# Patient Record
Sex: Male | Born: 1975 | Race: Black or African American | Hispanic: No | Marital: Single | Smoking: Never smoker
Health system: Southern US, Community
[De-identification: ages and names within clinical notes are randomized; demographics above are authoritative.]

## PROBLEM LIST (undated history)

## (undated) HISTORY — PX: HERNIA REPAIR: SHX51

---

## 2014-07-28 ENCOUNTER — Emergency Department
Admission: EM | Admit: 2014-07-28 | Discharge: 2014-07-28 | Disposition: A | Discharge status: Home / Self Care | Payer: Self-pay | Attending: Emergency Medicine | Admitting: Emergency Medicine

## 2014-07-28 ENCOUNTER — Encounter: Payer: Self-pay | Admitting: Emergency Medicine

## 2014-07-28 DIAGNOSIS — R197 Diarrhea, unspecified: Secondary | ICD-10-CM | POA: Insufficient documentation

## 2014-07-28 DIAGNOSIS — R112 Nausea with vomiting, unspecified: Secondary | ICD-10-CM

## 2014-07-28 DIAGNOSIS — K759 Inflammatory liver disease, unspecified: Secondary | ICD-10-CM | POA: Insufficient documentation

## 2014-07-28 LAB — COMPREHENSIVE METABOLIC PANEL
ALBUMIN: 4 g/dL (ref 3.5–5.0)
ALT: 123 U/L — ABNORMAL HIGH (ref 17–63)
AST: 168 U/L — ABNORMAL HIGH (ref 15–41)
Alkaline Phosphatase: 121 U/L (ref 38–126)
BUN: 12 mg/dL (ref 6–20)
CO2: 25 mmol/L (ref 22–32)
Calcium: 9.1 mg/dL (ref 8.9–10.3)
Chloride: 101 mmol/L (ref 101–111)
Creatinine, Ser: 0.76 mg/dL (ref 0.61–1.24)
GFR calc Af Amer: >60 mL/min (ref >60)
GLUCOSE: 106 mg/dL — AB (ref 65–99)
POTASSIUM: 3.9 mmol/L (ref 3.5–5.1)
Total Bilirubin: 0.7 mg/dL (ref 0.3–1.2)

## 2014-07-28 LAB — URINALYSIS COMPLETE WITH MICROSCOPIC (ARMC ONLY)
Bacteria, UA: NONE SEEN
Bilirubin Urine: NEGATIVE
GLUCOSE, UA: NEGATIVE mg/dL
HGB URINE DIPSTICK: NEGATIVE
Ketones, ur: NEGATIVE mg/dL
Leukocytes, UA: NEGATIVE
NITRITE: NEGATIVE
PH: 5 (ref 5.0–8.0)
Protein, ur: NEGATIVE mg/dL
Specific Gravity, Urine: 1.021 (ref 1.005–1.030)
Squamous Epithelial / LPF: NONE SEEN

## 2014-07-28 LAB — CBC
HCT: 44.4 % (ref 40.0–52.0)
Hemoglobin: 15.3 g/dL (ref 13.0–18.0)
MCH: 31.1 pg (ref 26.0–34.0)
MCHC: 34.6 g/dL (ref 32.0–36.0)
MCV: 89.8 fL (ref 80.0–100.0)
PLATELETS: 233 10*3/uL (ref 150–440)
RBC: 4.94 MIL/uL (ref 4.40–5.90)
RDW: 12.8 % (ref 11.5–14.5)
WBC: 6.6 10*3/uL (ref 3.8–10.6)

## 2014-07-28 LAB — ETHANOL: Alcohol, Ethyl (B): 172 mg/dL — ABNORMAL HIGH (ref <5)

## 2014-07-28 LAB — LIPASE, BLOOD: Lipase: 24 U/L (ref 22–51)

## 2014-07-28 MED ORDER — SODIUM CHLORIDE 0.9 % IV BOLUS (SEPSIS)
1000.0000 mL | Freq: Once | INTRAVENOUS | Status: AC
  Administered 2014-07-28: 1000 mL via INTRAVENOUS

## 2014-07-28 MED ORDER — METOCLOPRAMIDE HCL 5 MG/ML IJ SOLN
10.0000 mg | Freq: Once | INTRAMUSCULAR | Status: AC
  Administered 2014-07-28: 10 mg via INTRAVENOUS
  Filled 2014-07-28: qty 2

## 2014-07-28 MED ORDER — ONDANSETRON HCL 4 MG PO TABS: 4.0000 mg | ORAL_TABLET | Freq: Every day | ORAL | Status: DC | PRN

## 2014-07-28 MED ORDER — ONDANSETRON HCL 4 MG PO TABS: 4.0000 mg | ORAL_TABLET | Freq: Two times a day (BID) | ORAL | Status: DC | PRN

## 2014-07-28 MED ORDER — ONDANSETRON 4 MG PO TBDP
4.0000 mg | ORAL_TABLET | Freq: Once | ORAL | Status: AC | PRN
  Administered 2014-07-28: 4 mg via ORAL
  Filled 2014-07-28: qty 1

## 2014-07-28 NOTE — ED Provider Notes (Signed)
[facility] Emergency Department Provider Note  ____________________________________________  Time seen: 1:50 AM  I have reviewed the triage vital signs and the nursing notes.   HISTORY  Chief Complaint No chief complaint on file.  nausea vomiting for one week    HPI Austin Stone is a 39 y.o. male who reports she has been having nausea and has been vomiting for one week. He reports having loose stools. He reports the lower part of his abdomen has been a little uncomfortable, although it is not hurting him currently.  The patient does drink 7-8 beers a day  No past medical history on file.    There are no active problems to display for this patient.   Past Surgical History  Procedure Laterality Date  . Hernia repair      Current Outpatient Rx  Name  Route  Sig  Dispense  Refill  . ondansetron (ZOFRAN) 4 MG tablet   Oral   Take 1 tablet (4 mg total) by mouth 2 (two) times daily as needed for nausea or vomiting.   20 tablet   0   . ondansetron (ZOFRAN) 4 MG tablet   Oral   Take 1 tablet (4 mg total) by mouth daily as needed for nausea or vomiting.   12 tablet   0     Allergies Review of patient's allergies indicates no known allergies.  Family History  Problem Relation Age of Onset  . Asthma Mother   . Hypertension Father   . Diabetes Father     Social History History  Substance Use Topics  . Smoking status: Not on file  . Smokeless tobacco: Not on file  . Alcohol Use: 6.0 oz/week    10 Cans of beer per week    Review of Systems  Constitutional: Negative for fever. ENT: Negative for sore throat. Cardiovascular: Negative for chest pain. Respiratory: Negative for shortness of breath. Gastrointestinal: Positive for abdominal pain, vomiting and diarrhea. Genitourinary: Negative for dysuria. Musculoskeletal: No myalgias or injuries. Skin: Negative for rash. Neurological: Negative for headaches   10-point ROS otherwise  negative.  ____________________________________________   PHYSICAL EXAM:  VITAL SIGNS: ED Triage Vitals  Enc Vitals Group     BP 07/28/14 0045 155/94 mmHg     Pulse Rate 07/28/14 0045 73     Resp 07/28/14 0045 18     Temp 07/28/14 0045 97.5 F (36.4 C)     Temp Source 07/28/14 0045 Oral     SpO2 07/28/14 0045 100 %     Weight 07/28/14 0045 235 lb (106.595 kg)     Height 07/28/14 0045  (1.727 m)     Head Cir --      Peak Flow --      Pain Score --      Pain Loc --      Pain Edu? --      Excl. in GC? --     Constitutional:  Alert and oriented. Appears a little bit uncomfortable but no acute distress. ENT   Head: Normocephalic and atraumatic.   Nose: No congestion/rhinnorhea.   Mouth/Throat: Mucous membranes are moist. Cardiovascular: Normal rate, regular rhythm, no murmur noted Respiratory:  Normal respiratory effort, no tachypnea.    Breath sounds are clear and equal bilaterally.  Gastrointestinal: Soft and nontender. Normal bowel sounds.  Back: No muscle spasm, no tenderness, no CVA tenderness. Musculoskeletal: No deformity noted. Nontender with normal range of motion in all extremities.  No noted edema. Neurologic:  Normal speech and language. No gross focal neurologic deficits are appreciated.  Skin:  Skin is warm, dry. No rash noted. Psychiatric: Mood and affect are normal. Speech and behavior are normal.  ____________________________________________    LABS (pertinent positives/negatives)  Labs Reviewed  COMPREHENSIVE METABOLIC PANEL - Abnormal; Notable for the following:    Glucose, Bld 106 (*)    AST 168 (*)    ALT 123 (*)    All other components within normal limits  URINALYSIS COMPLETEWITH MICROSCOPIC ([facility] ONLY) - Abnormal; Notable for the following:    Color, Urine YELLOW (*)    APPearance CLEAR (*)    All other components within normal limits  ETHANOL - Abnormal; Notable for the following:    Alcohol, Ethyl (B) 172 (*)    All other  components within normal limits  LIPASE, BLOOD  CBC    ____________________________________________   INITIAL IMPRESSION / ASSESSMENT AND PLAN / ED COURSE  Pertinent labs & imaging results that were available during my care of the patient were reviewed by me and considered in my medical decision making (see chart for details).  Alert patient with ongoing nausea. He did receive Zofran which did not help very much. We'll give him a dose of Reglan and a liter of normal saline. His blood hemolyzed and I do not have a sodium level due to lipemict interference.  His LFTs are slightly elevated with AST little higher than ALT. This is likely due to his alcohol use. We've discussed his alcohol consumption and I have advised him to reduce this due to his current symptoms and the LFT results. He has a somewhat obese belly, but I do not appreciate any ascites.  ----------------------------------------- 4:58 AM on 07/28/2014 -----------------------------------------  I've been waiting for the urinalysis for a bit. It is now returned normal with no acute tones and no sign of infection. Reassessment of the patient finds him comfortable without nausea. We've discussed his liver enzyme levels and the need for reassessment and follow-up.    ____________________________________________   FINAL CLINICAL IMPRESSION(S) / ED DIAGNOSES  Final diagnoses:  Nausea vomiting and diarrhea  Hepatitis      Darien Ramusavid W Tedric Leeth, MD 07/28/14 [phone]

## 2014-07-28 NOTE — ED Notes (Signed)
Pt. States vomiting for the past week.  Pt. States he has been unable to hold down food for the past week.  Pt. States "I do not get sick, I do not take medications"  Pt. States he has not taken anything for current symptoms.

## 2014-07-28 NOTE — Discharge Instructions (Signed)
You have a mild elevation in your liver enzyme levels that could be from drinking alcohol. Reduce or stop altogether your alcohol consumption. [facility] or with the gastroenterologist for reassessment and further evaluation.  Take Zofran if needed for nausea. Return to the emergency department if your nausea is not controlled or you have other urgent concerns.  Nausea, Adult Nausea means you feel sick to your stomach or need to throw up (vomit). It may be a sign of a more serious problem. If nausea gets worse, you may throw up. If you throw up a lot, you may lose too much body fluid (dehydration). HOME CARE   Get plenty of rest.  Ask your doctor how to replace body fluid losses (rehydrate).  Eat small amounts of food. Sip liquids more often.  Take all medicines as told by your doctor. GET HELP RIGHT AWAY IF:  You have a fever.  You pass out (faint).  You keep throwing up or have blood in your throw up.  You are very weak, have dry lips or a dry mouth, or you are very thirsty (dehydrated).  You have dark or bloody poop (stool).  You have very bad chest or belly (abdominal) pain.  You do not get better after 2 days, or you get worse.  You have a headache. MAKE SURE YOU:  Understand these instructions.  Will watch your condition.  Will get help right away if you are not doing well or get worse. Document Released: 12/16/2010 Document Revised: 03/21/2011 Document Reviewed: 12/16/2010 [facility] Patient Information 2015 [employer] [place]. This information is not intended to replace advice given to you by your health care provider. Make sure you discuss any questions you have with your health care provider.
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
## 2014-07-31 ENCOUNTER — Other Ambulatory Visit: Payer: Self-pay | Admitting: Student

## 2014-07-31 DIAGNOSIS — R1084 Generalized abdominal pain: Secondary | ICD-10-CM

## 2014-08-08 ENCOUNTER — Ambulatory Visit: Payer: Self-pay

## 2015-06-03 ENCOUNTER — Emergency Department: Payer: Self-pay

## 2015-06-03 ENCOUNTER — Emergency Department
Admission: EM | Admit: 2015-06-03 | Discharge: 2015-06-03 | Disposition: A | Discharge status: Home / Self Care | Payer: Self-pay | Attending: Student | Admitting: Student

## 2015-06-03 ENCOUNTER — Encounter: Payer: Self-pay | Admitting: Emergency Medicine

## 2015-06-03 DIAGNOSIS — R52 Pain, unspecified: Secondary | ICD-10-CM

## 2015-06-03 DIAGNOSIS — R1010 Upper abdominal pain, unspecified: Secondary | ICD-10-CM | POA: Insufficient documentation

## 2015-06-03 DIAGNOSIS — R079 Chest pain, unspecified: Secondary | ICD-10-CM | POA: Insufficient documentation

## 2015-06-03 LAB — CBC
HCT: 42.8 % (ref 40.0–52.0)
Hemoglobin: 14.7 g/dL (ref 13.0–18.0)
MCH: 30.7 pg (ref 26.0–34.0)
MCHC: 34.3 g/dL (ref 32.0–36.0)
MCV: 89.4 fL (ref 80.0–100.0)
Platelets: 160 10*3/uL (ref 150–440)
RBC: 4.79 MIL/uL (ref 4.40–5.90)
RDW: 12.7 % (ref 11.5–14.5)
WBC: 3.7 10*3/uL — AB (ref 3.8–10.6)

## 2015-06-03 LAB — BASIC METABOLIC PANEL
Anion gap: 10 (ref 5–15)
BUN: 7 mg/dL (ref 6–20)
CHLORIDE: 97 mmol/L — AB (ref 101–111)
CO2: 27 mmol/L (ref 22–32)
CREATININE: 0.84 mg/dL (ref 0.61–1.24)
Calcium: 9.5 mg/dL (ref 8.9–10.3)
GFR calc Af Amer: >60 mL/min (ref >60)
GFR calc non Af Amer: >60 mL/min (ref >60)
GLUCOSE: 135 mg/dL — AB (ref 65–99)
Potassium: 3.6 mmol/L (ref 3.5–5.1)
SODIUM: 134 mmol/L — AB (ref 135–145)

## 2015-06-03 LAB — HEPATIC FUNCTION PANEL
ALK PHOS: 94 U/L (ref 38–126)
ALT: 105 U/L — ABNORMAL HIGH (ref 17–63)
AST: 98 U/L — ABNORMAL HIGH (ref 15–41)
Albumin: 4.3 g/dL (ref 3.5–5.0)
BILIRUBIN INDIRECT: 1.8 mg/dL — AB (ref 0.3–0.9)
BILIRUBIN TOTAL: 2 mg/dL — AB (ref 0.3–1.2)
Bilirubin, Direct: 0.2 mg/dL (ref 0.1–0.5)
Total Protein: 7.6 g/dL (ref 6.5–8.1)

## 2015-06-03 LAB — PROTIME-INR
INR: 0.99
PROTHROMBIN TIME: 13.3 s (ref 11.4–15.0)

## 2015-06-03 LAB — LIPASE, BLOOD: Lipase: 27 U/L (ref 11–51)

## 2015-06-03 LAB — TROPONIN I: Troponin I: <0.03 ng/mL (ref <0.031)

## 2015-06-03 LAB — APTT: aPTT: 30 seconds (ref 24–36)

## 2015-06-03 MED ORDER — SODIUM CHLORIDE 0.9 % IV BOLUS (SEPSIS)
500.0000 mL | Freq: Once | INTRAVENOUS | Status: AC
Start: 2015-06-03 — End: 2015-06-03
  Administered 2015-06-03: 500 mL via INTRAVENOUS

## 2015-06-03 MED ORDER — ONDANSETRON 4 MG PO TBDP
4.0000 mg | ORAL_TABLET | Freq: Once | ORAL | Status: DC
  Filled 2015-06-03: qty 1

## 2015-06-03 MED ORDER — ONDANSETRON HCL 4 MG/2ML IJ SOLN
4.0000 mg | Freq: Once | INTRAMUSCULAR | Status: AC
  Administered 2015-06-03: 4 mg via INTRAVENOUS
  Filled 2015-06-03: qty 2

## 2015-06-03 MED ORDER — DOXYCYCLINE HYCLATE 50 MG PO CAPS: 100.0000 mg | ORAL_CAPSULE | Freq: Two times a day (BID) | ORAL | Status: AC

## 2015-06-03 MED ORDER — ONDANSETRON 4 MG PO TBDP: 4.0000 mg | ORAL_TABLET | Freq: Three times a day (TID) | ORAL | Status: AC | PRN

## 2015-06-03 MED ORDER — IOPAMIDOL (ISOVUE-370) INJECTION 76%
100.0000 mL | Freq: Once | INTRAVENOUS | Status: AC | PRN
  Administered 2015-06-03: 100 mL via INTRAVENOUS

## 2015-06-03 MED ORDER — MORPHINE SULFATE (PF) 4 MG/ML IV SOLN
4.0000 mg | Freq: Once | INTRAVENOUS | Status: AC
  Administered 2015-06-03: 4 mg via INTRAVENOUS
  Filled 2015-06-03: qty 1

## 2015-06-03 NOTE — ED Notes (Signed)
MD at bedside. 

## 2015-06-03 NOTE — ED Notes (Signed)
Patient transported to Ultrasound 

## 2015-06-03 NOTE — Discharge Instructions (Signed)
As we discussed, please take the doxycycline as prescribed and follow-up with a primary care doctor as soon as possible for recheck of your blood pressure and your liver function tests which were elevated. Return immediately to the emergency department if you develop or severe worsening pain, recurrent vomiting, fevers, rash, numbness, weakness, severe headache, severe chest pain, severe abdominal pain or for any other concerns.

## 2015-06-03 NOTE — ED Notes (Signed)
Pt instructed to followup with MD about blood pressure

## 2015-06-03 NOTE — ED Notes (Signed)
Pt presents to ED with chest pain that radiates across his chest and shortness of breath. Pt states he thinks he "got bitten by a tick about a week ago and maybe pulled it off and didn't know it." vomiting since yesterday evening X5. Pt states there is a bump where the tick was and the area is itchy. Pt also reports that his "stomach has been tingling since then too". Pt currently has no increased work of breathing or acute distress noted.

## 2015-06-03 NOTE — ED Notes (Addendum)
Pt presents to ED with chest pain that radiates across his chest, dizziness, nausea, vomiting, numbness to finger tips, shortness of breath. Pt states he thinks he "got bitten by a tick about a week ago and maybe pulled it off and didn't know it."  Vomiting since yesterday evening X5 Diarrhea x1 last night. Pt states there is a bump where the tick was and the area is itchy. I didn't observe a sore on lower abd. Pt also reports that his "stomach has been tingling since then too". Lower abd pain while palpating. Pt currently has no increased work of breathing or acute distress noted. Will continue to monitor.

## 2015-06-03 NOTE — ED Notes (Signed)
Patient transported to CT 

## 2015-06-03 NOTE — ED Notes (Signed)
Pt alert and oriented X4, active, cooperative, pt in NAD. RR even and unlabored, color WNL.  Pt informed to return if any life threatening symptoms occur.   

## 2015-06-03 NOTE — ED Provider Notes (Signed)
[facility] Emergency Department Provider Note   ____________________________________________  Time seen: Approximately 6:59 AM  I have reviewed the triage vital signs and the nursing notes.   HISTORY  Chief Complaint Chest Pain    HPI Austin Stone is a 40 y.o. male with history of HTN who presents with 1 week upper abdominal pain and possible tick exposure, constant since onset mild to moderate, no modifying factors. Patient reports that possibly a week ago he had a tick bite on the lower abdomen and that he scratched. He reports that since that time he has felt nauseated. Yesterday he had 5 episodes of nonbloody nonbilious emesis and 3 episodes of nonbloody diarrhea. He has had subjective fevers and chills as well as intermittent headaches. He has had intermittent bilateral chest pains which last "a few seconds". The pain is not exertional, not radiating but is associated with some shortness of breath at times. He denies any chest pain currently. No known coronary artery disease, no family history of early coronary artery disease. No personal or family history of PE or DVT.   History reviewed. No pertinent past medical history.  There are no active problems to display for this patient.   Past Surgical History  Procedure Laterality Date  . Hernia repair      Current Outpatient Rx  Name  Route  Sig  Dispense  Refill  . doxycycline (VIBRAMYCIN) 50 MG capsule   Oral   Take 2 capsules (100 mg total) by mouth 2 (two) times daily.   40 capsule   0   . ondansetron (ZOFRAN ODT) 4 MG disintegrating tablet   Oral   Take 1 tablet (4 mg total) by mouth every 8 (eight) hours as needed for nausea or vomiting.   12 tablet   0     Allergies Review of patient's allergies indicates no known allergies.  Family History  Problem Relation Age of Onset  . Asthma Mother   . Hypertension Father   . Diabetes Father     Social History Social History    Substance Use Topics  . Smoking status: Never Smoker   . Smokeless tobacco: None  . Alcohol Use: 6.0 oz/week    10 Cans of beer per week    Review of Systems Constitutional: + subjective fever/chills Eyes: No visual changes. ENT: No sore throat. Cardiovascular: + chest pain. Respiratory: + shortness of breath. Gastrointestinal: + abdominal pain.  + nausea, + vomiting.  + diarrhea.  No constipation. Genitourinary: Negative for dysuria. Musculoskeletal: Negative for back pain. Skin: Negative for rash. Neurological: Positive for headaches, focal weakness or numbness.  10-point ROS otherwise negative.  ____________________________________________   PHYSICAL EXAM:  VITAL SIGNS: ED Triage Vitals  Enc Vitals Group     BP 06/03/15 0629 183/124 mmHg     Pulse Rate 06/03/15 0629 72     Resp 06/03/15 0629 20     Temp 06/03/15 0629 97.4 F (36.3 C)     Temp Source 06/03/15 0629 Oral     SpO2 06/03/15 0629 96 %     Weight 06/03/15 0629 238 lb (107.956 kg)     Height 06/03/15 0629 5\' 8"  (1.727 m)     Head Cir --      Peak Flow --      Pain Score 06/03/15 0628 7     Pain Loc --      Pain Edu? --      Excl. in GC? --  Constitutional: Alert and oriented. Well appearing and in no acute distress. Eyes: Conjunctivae are normal. PERRL. EOMI. Head: Atraumatic. Nose: No congestion/rhinnorhea. Mouth/Throat: Mucous membranes are moist.  Oropharynx non-erythematous. Neck: No stridor.  Supple without meningismus. Cardiovascular: Normal rate, regular rhythm. Grossly normal heart sounds.  Good peripheral circulation. Respiratory: Normal respiratory effort.  No retractions. Lungs CTAB. Gastrointestinal: Abdomen is soft, there is tenderness to palpation in the epigastrium and the right upper quadrant. No rebound, no guarding. No CVA tenderness. Genitourinary: deferred Musculoskeletal: No lower extremity tenderness nor edema.  No joint effusions. Neurologic:  Normal speech and language.  No gross focal neurologic deficits are appreciated.  Skin:  Skin is warm, dry and intact. No rash noted. Psychiatric: Mood and affect are normal. Speech and behavior are normal.  ____________________________________________   LABS (all labs ordered are listed, but only abnormal results are displayed)  Labs Reviewed  BASIC METABOLIC PANEL - Abnormal; Notable for the following:    Sodium 134 (*)    Chloride 97 (*)    Glucose, Bld 135 (*)    All other components within normal limits  CBC - Abnormal; Notable for the following:    WBC 3.7 (*)    All other components within normal limits  HEPATIC FUNCTION PANEL - Abnormal; Notable for the following:    AST 98 (*)    ALT 105 (*)    Total Bilirubin 2.0 (*)    Indirect Bilirubin 1.8 (*)    All other components within normal limits  TROPONIN I  LIPASE, BLOOD  PROTIME-INR  APTT   ____________________________________________  EKG  ED ECG REPORT I, Gayla Doss, the attending physician, personally viewed and interpreted this ECG.   Date: 06/03/2015  EKG Time: 06:31  Rate: 78  Rhythm: normal sinus rhythm  Axis: normal  Intervals:none  ST&T Change: No acute ST elevation. Q waves and T-wave inversion isolated to lead 3. RSR prime in V1 and V2.  ____________________________________________  RADIOLOGY  CXR IMPRESSION: 1. Mild to moderate cardiomegaly. Although the cardiac contour is not strongly suggestive, consider the possibility of pericardial effusion in this setting. 2. No superimposed acute cardiopulmonary abnormality.  CTA chest/CT abdomen and pelvis  IMPRESSION: 1. No evidence of acute pulmonary embolus. 2. Cardiomegaly. No pericardial effusion. No acute pulmonary process. 3. Fatty liver disease. 4. Occasional large bowel diverticula with no active inflammation.  RUQ ultrasound IMPRESSION: 1. No gallstones or sonographic evidence of acute cholecystitis. If gallbladder dysfunction is suspected clinically, a  nuclear medicine hepatobiliary scan may be useful. 2. Fatty infiltrative change of the liver.  ____________________________________________   PROCEDURES  Procedure(s) performed: None  Critical Care performed: No  ____________________________________________   INITIAL IMPRESSION / ASSESSMENT AND PLAN / ED COURSE  Pertinent labs & imaging results that were available during my care of the patient were reviewed by me and considered in my medical decision making (see chart for details).  Austin Stone is a 40 y.o. male with history of HTN who presents with 1 week upper abdominal pain and possible tick exposure. On exam, he is very well-appearing and in no acute distress. Vital signs are notable for hypertension with a blood pressure of 183/124 on arrival, improved to 164/119 without any Intervention. He reports that he previously was prescribed antihypertensive medications but his doctor took him off of them. His exam is notable for some epigastric and right upper quadrant tenderness. There is no rash on the abdomen, no erythema migrans, no evidence of local reaction to a tick bite.  His EKG is reassuring and not consistent with acute ischemia. Plan for screening labs, chest x-ray, right upper quadrant ultrasound, we'll treat his pain and reassess for disposition.  ----------------------------------------- 10:41 AM on 06/03/2015 ----------------------------------------- The patient continues to appear well, he is not requiring any medications for pain. He is tolerating by mouth intake without vomiting. I reviewed his labs. BMP is generally unremarkable. CBC and CMP are notable for minor abnormalities including very mild leukopenia with white blood cell count of 3.7. Normal lipase, negative troponin. LFTs show mild AST and ALT elevation however this appears to be chronic and is improved from his hepatic function panel obtained 10 months ago. He does have mild elevation of his T bili with  indirect hyperbilirubinemia. He has normal coags without evidence of liver failure. Clinical picture is not consistent with acute biliary obstruction. His chest x-ray was read as cardiomegaly with concern for possible pericardial effusion. Troponin was negative, heart score 2, low risk for ACS. I obtained a CTA of his chest as well as CT of the abdomen and pelvis, there is no PE, there is no pericardial effusion and there is no radiologically identified cause for his complaints today. I discussed with him that the exact cause of his symptoms is unknown. Symptoms may be related to a virus but given his recent possible tick exposure, we'll treat with doxycycline. Discussed that he needs to follow up quickly with a primary care doctor regarding his high blood pressure and for recheck, we discussed return precautions and need for close follow-up and he is comfortable with the discharge plan. DC home. ____________________________________________   FINAL CLINICAL IMPRESSION(S) / ED DIAGNOSES  Final diagnoses:  Pain  Chest pain, unspecified chest pain type  Pain of upper abdomen      NEW MEDICATIONS STARTED DURING THIS VISIT:  Discharge Medication List as of 06/03/2015 10:56 AM    START taking these medications   Details  doxycycline (VIBRAMYCIN) 50 MG capsule Take 2 capsules (100 mg total) by mouth 2 (two) times daily., Starting 06/03/2015, Until Discontinued, Print    ondansetron (ZOFRAN ODT) 4 MG disintegrating tablet Take 1 tablet (4 mg total) by mouth every 8 (eight) hours as needed for nausea or vomiting., Starting 06/03/2015, Until Discontinued, Print         Note:  This document was prepared using Dragon voice recognition software and may include unintentional dictation errors.    Gayla DossEryka A Mae Cianci, MD 06/03/15 [phone]

## 2017-05-26 IMAGING — CR DG CHEST 2V
1 series · 2 of 2 positions shown · non-contrast
Comparison: None.

CLINICAL DATA: 39-year-old male with chest pain dizziness nausea
vomiting and shortness of Breath. Tick bite 1 week ago. Initial
encounter.

EXAM:
CHEST  2 VIEW

[Series 1: dg chest 2 view · 0.14mm/px · 2 of 2 slices shown]
[im 1/2]
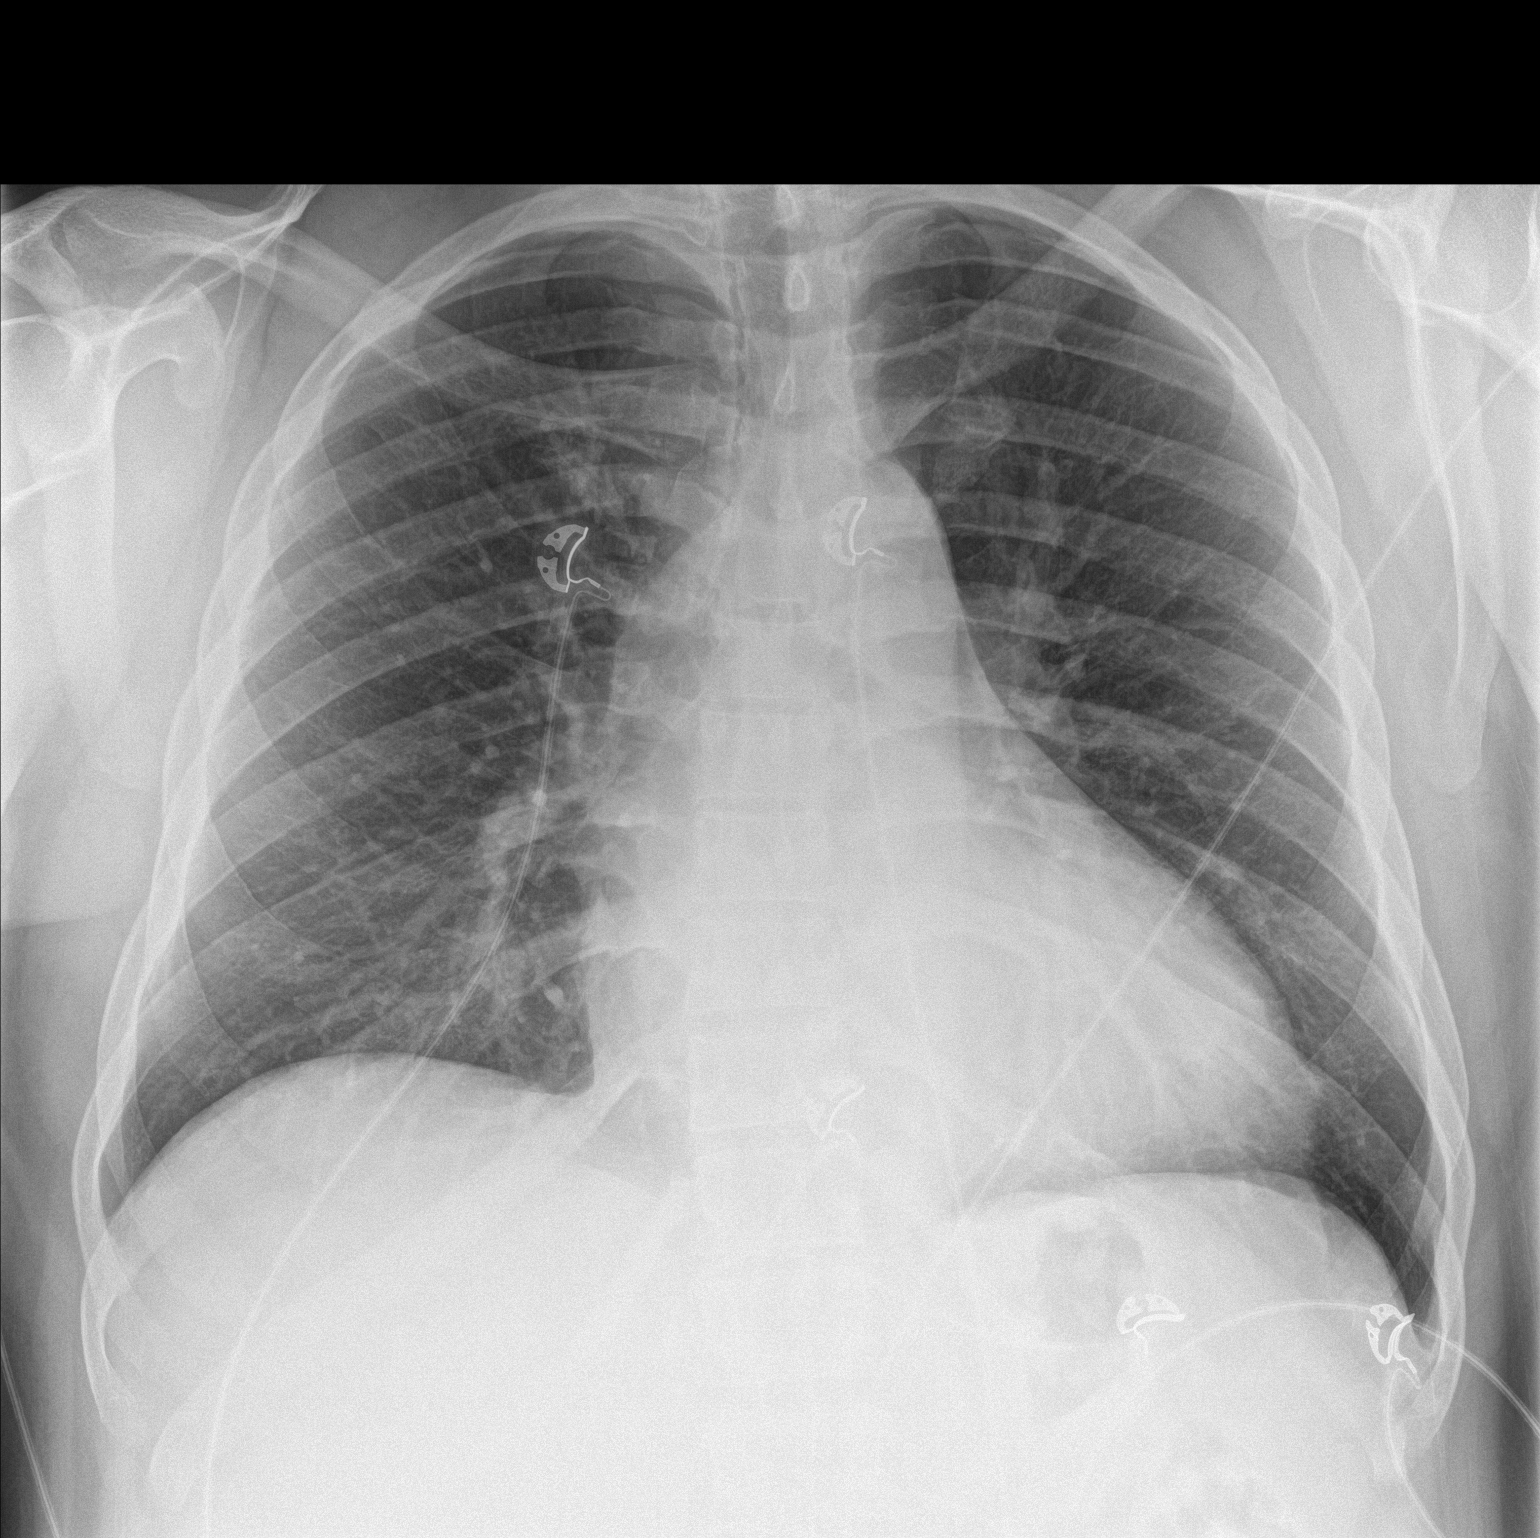
[im 2/2]
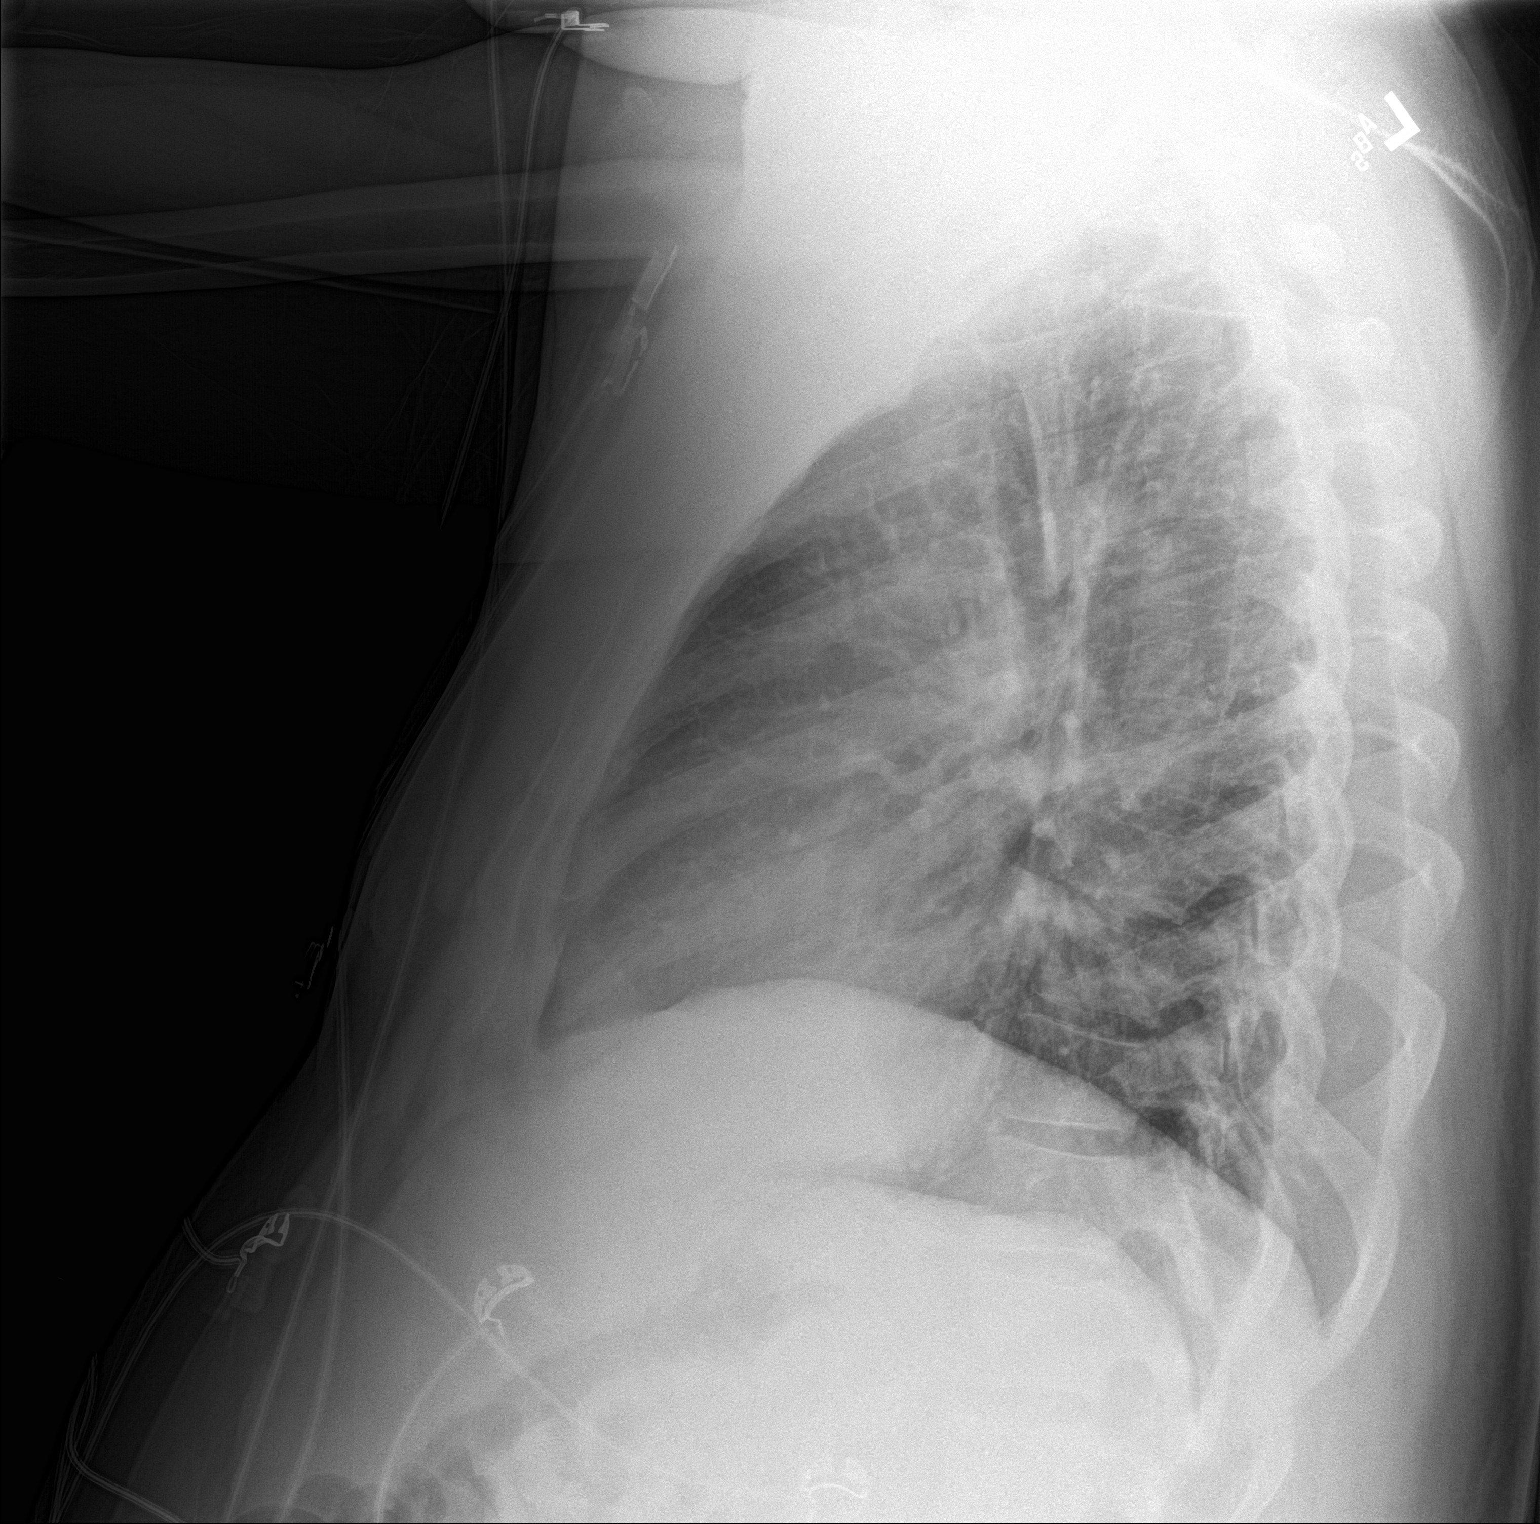

[2 of 2 positions shown; findings below may reference images not displayed]

FINDINGS: Mild to moderate cardiomegaly. Other mediastinal contours are within
normal limits. Visualized tracheal air column is within normal
limits. No pulmonary edema, pneumothorax or pleural effusion. No
confluent pulmonary opacity. Very mild thoracic scoliosis. No other
osseous abnormality identified.
IMPRESSION: 1. Mild to moderate cardiomegaly. Although the cardiac contour is
not strongly suggestive, consider the possibility of pericardial
effusion in this setting.
2. No superimposed acute cardiopulmonary abnormality.

## 2017-05-26 IMAGING — CT CT ANGIO CHEST
3 of 10 series · 18 of 46 positions shown · IV contrast (APPLIED)
Comparison: Chest radiographs 1117 hours today. Abdomen ultrasound
8383 hours.

CLINICAL DATA: 39-year-old male with intermittent chest pain and
shortness of breath for 1 week. Right abdominal pain for 2 days with
nausea and vomiting yesterday. Initial encounter.

EXAM:
CT ANGIOGRAPHY CHEST
CT ABDOMEN AND PELVIS WITH CONTRAST
TECHNIQUE: Multidetector CT imaging of the chest was performed using the
standard protocol during bolus administration of intravenous
contrast. Multiplanar CT image reconstructions and MIPs were
obtained to evaluate the vascular anatomy. Multidetector CT imaging
of the abdomen and pelvis was performed using the standard protocol
during bolus administration of intravenous contrast.
CONTRAST:  100 mL Isovue 370

[Series 5: pe thins 1.5 · axial · 0.68mm/px · z∈[+346,+602]mm · 13 of 249 slices shown]
[im 18/249  lung]
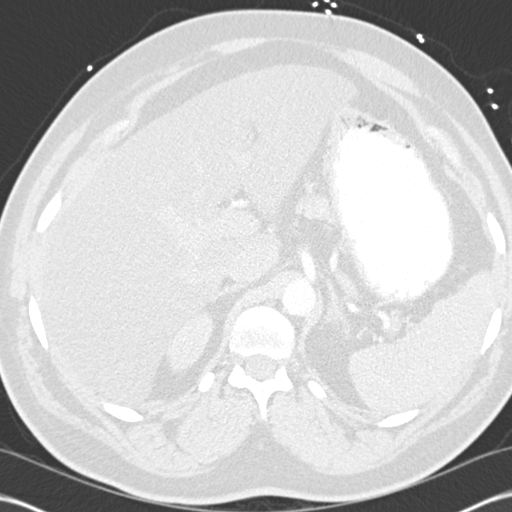
[im 36/249  soft-tissue]
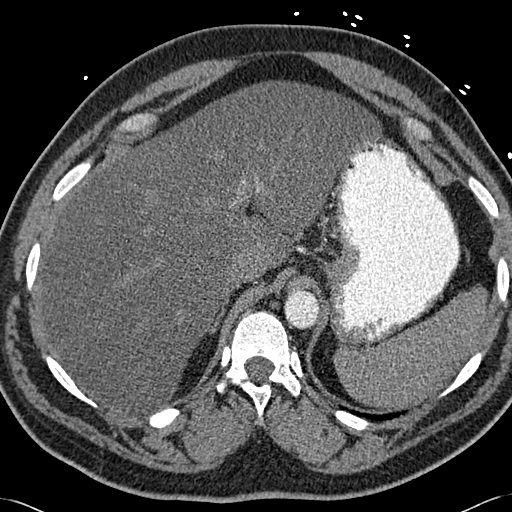
[im 54/249  lung]
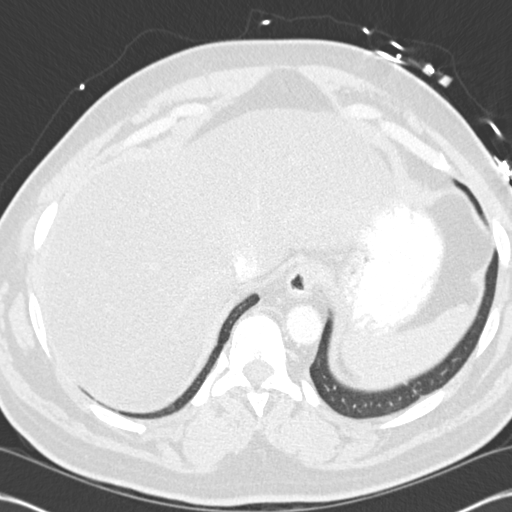
[im 71/249  soft-tissue]
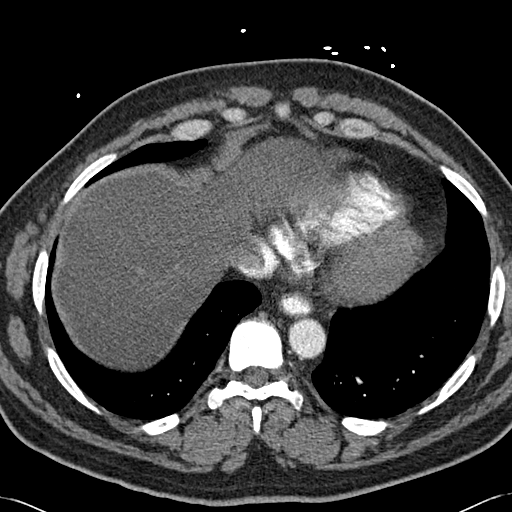
[im 89/249  lung]
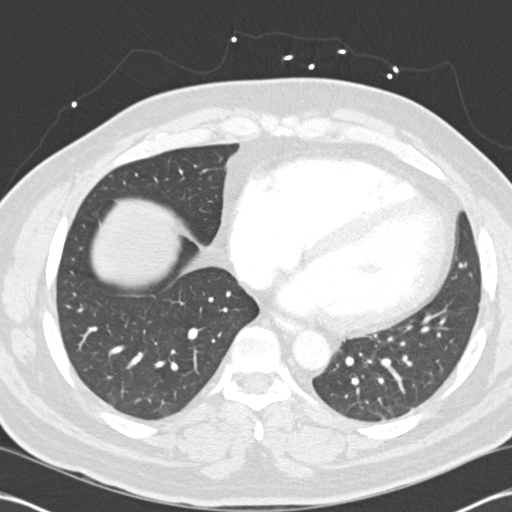
[im 107/249  soft-tissue]
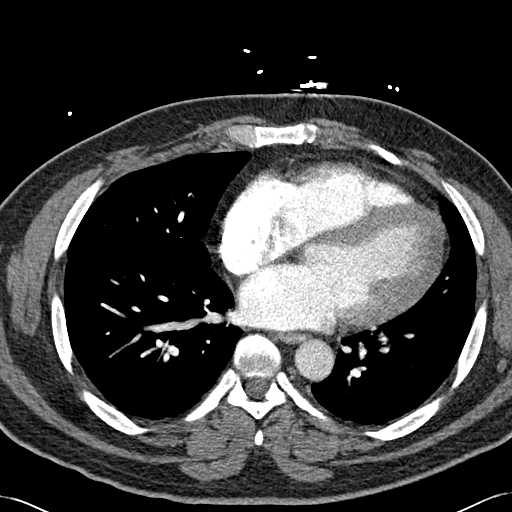
[im 125/249  lung]
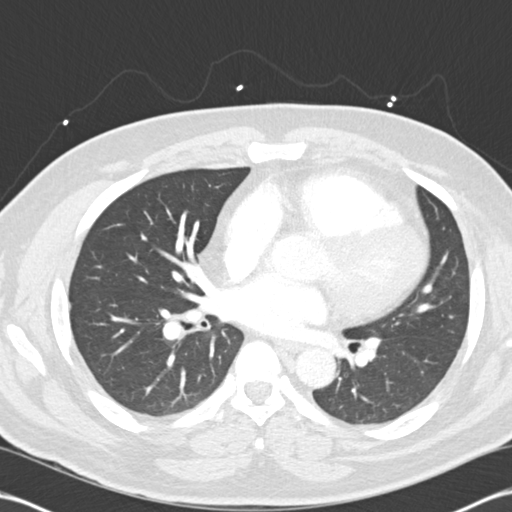
[im 142/249  soft-tissue]
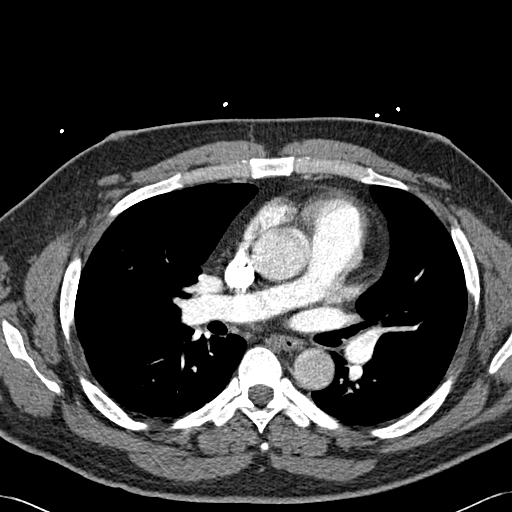
[im 160/249  lung]
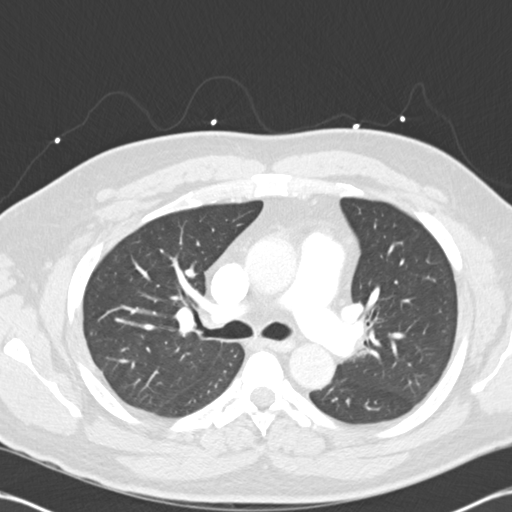
[im 178/249  soft-tissue]
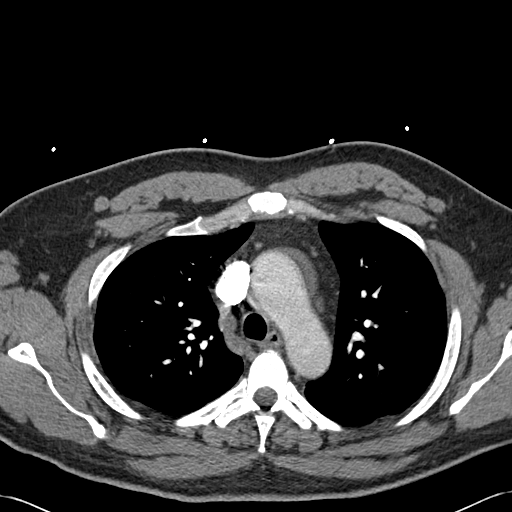
[im 195/249  lung]
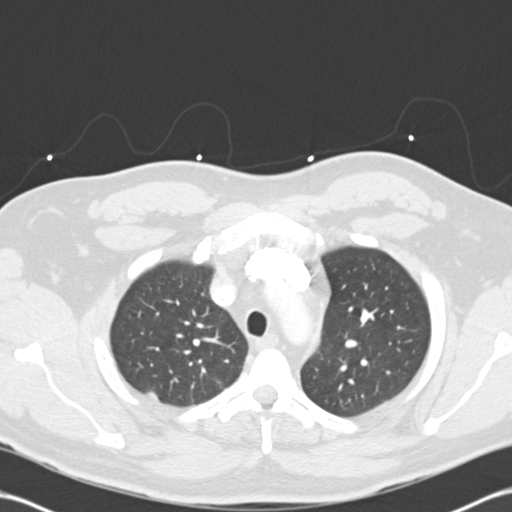
[im 213/249  soft-tissue]
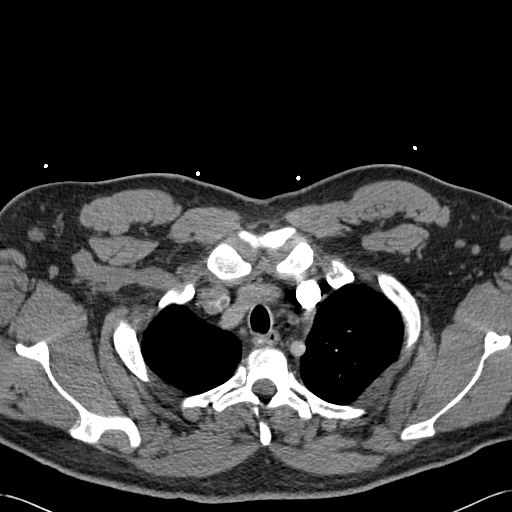
[im 231/249  lung]
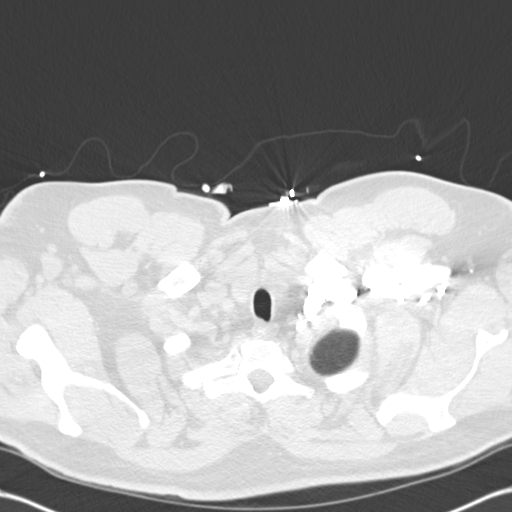

[Series 7: routine abd pel with · axial · 0.80mm/px · z∈[+63,+358]mm · 4 of 99 slices shown]
[im 20/99  lung]
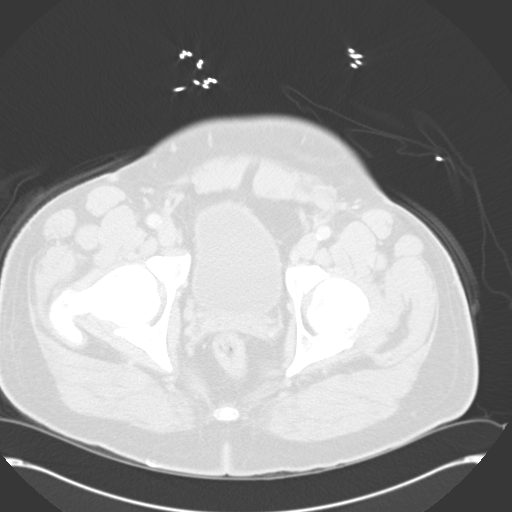
[im 40/99  lung]
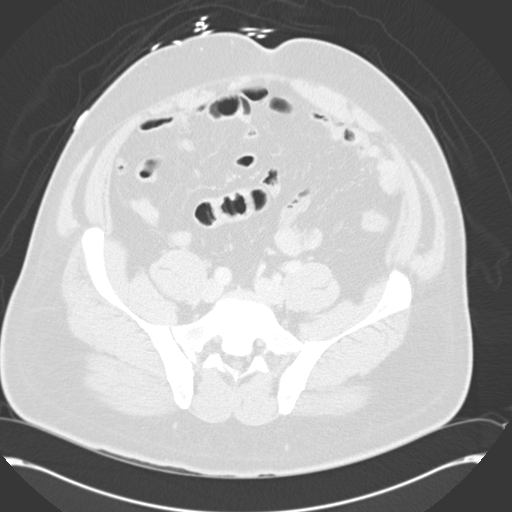
[im 59/99  lung]
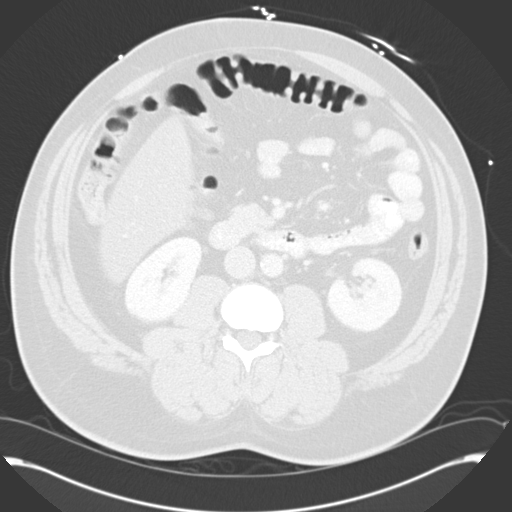
[im 79/99  lung]
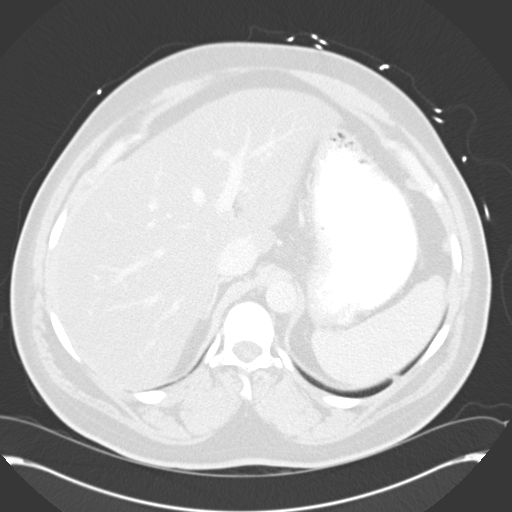

[Series 9: cor mpr 2.0 · coronal · 0.60mm/px · 1 of 160 slices shown]
[im 80/160  soft-tissue]
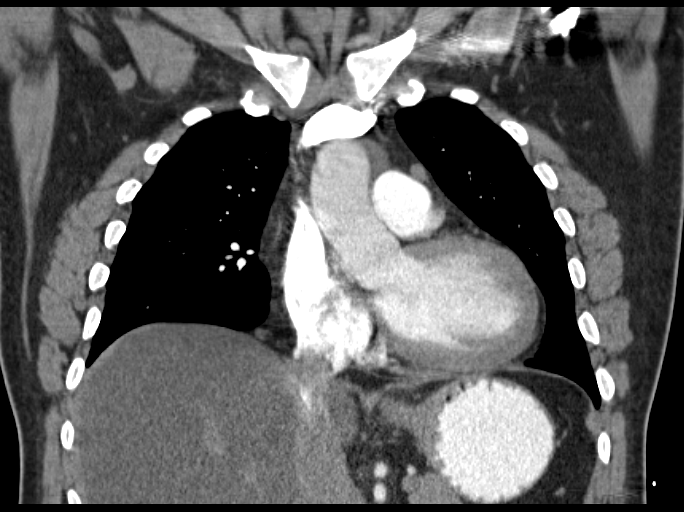

[18 of 46 positions shown; findings below may reference images not displayed]

FINDINGS: CTA CHEST FINDINGS

Adequate contrast bolus timing in the pulmonary arterial tree. Mild
respiratory motion artifact in the lower lobes. No focal filling
defect identified in the pulmonary arterial tree to suggest the
presence of acute pulmonary embolism.

Major airways are patent. No pleural effusion. Minimal left lower
lobe scarring or atelectasis. No other abnormal pulmonary opacity.

Cardiomegaly. No pericardial effusion. No thoracic aortic or
coronary artery calcified atherosclerosis is evident. No thoracic
lymphadenopathy. Small volume of retained oral contrast in the
esophagus. Negative thyroid.

Slight scoliosis. Mild straightening of thoracic kyphosis. No acute
osseous abnormality identified.

CT ABDOMEN and PELVIS FINDINGS

No acute osseous abnormality identified.

No pelvic free fluid. Unremarkable urinary bladder. Negative rectum.
Redundant but otherwise negative sigmoid colon.

Occasional left colon diverticula with no inflammation. Similar
findings distal transverse colon. Redundant hepatic flexure. The
right colon is largely decompressed. There are occasional
diverticula. Negative appendix. Negative terminal ileum. No dilated
small bowel. Oral contrast in the stomach and proximal jejunum.

No abdominal free air or free fluid. Hepatic steatosis. No discrete
liver lesion. Negative gallbladder, spleen, pancreas and adrenal
glands. Portal venous system is patent. Major arterial structures
appear normal; faint calcified atherosclerosis at the right
aortoiliac bifurcation only is noted. Both kidneys appear normal. No
hydroureter or or urologic calculus identified. No lymphadenopathy.
No focal inflammatory stranding.

Review of the MIP images confirms the above findings.
IMPRESSION: 1.  No evidence of acute pulmonary embolus.
2. Cardiomegaly. No pericardial effusion. No acute pulmonary
process.
3. Fatty liver disease.
4. Occasional large bowel diverticula with no active inflammation.
